# Patient Record
Sex: Male | Born: 1990 | Race: Black or African American | Hispanic: No | Marital: Married | Smoking: Current every day smoker
Health system: Southern US, Community
[De-identification: ages and names within clinical notes are randomized; demographics above are authoritative.]

---
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
## 2013-05-15 ENCOUNTER — Encounter (HOSPITAL_COMMUNITY): Payer: Self-pay | Admitting: Emergency Medicine

## 2013-05-15 ENCOUNTER — Emergency Department (HOSPITAL_COMMUNITY): Payer: No Typology Code available for payment source

## 2013-05-15 ENCOUNTER — Emergency Department (HOSPITAL_COMMUNITY)
Admission: EM | Admit: 2013-05-15 | Discharge: 2013-05-15 | Disposition: A | Discharge status: Home / Self Care | Payer: No Typology Code available for payment source | Attending: Emergency Medicine | Admitting: Emergency Medicine

## 2013-05-15 DIAGNOSIS — S46909A Unspecified injury of unspecified muscle, fascia and tendon at shoulder and upper arm level, unspecified arm, initial encounter: Secondary | ICD-10-CM | POA: Insufficient documentation

## 2013-05-15 DIAGNOSIS — F419 Anxiety disorder, unspecified: Secondary | ICD-10-CM

## 2013-05-15 DIAGNOSIS — Y92410 Unspecified street and highway as the place of occurrence of the external cause: Secondary | ICD-10-CM | POA: Insufficient documentation

## 2013-05-15 DIAGNOSIS — R0682 Tachypnea, not elsewhere classified: Secondary | ICD-10-CM | POA: Insufficient documentation

## 2013-05-15 DIAGNOSIS — R0602 Shortness of breath: Secondary | ICD-10-CM | POA: Insufficient documentation

## 2013-05-15 DIAGNOSIS — R000 Tachycardia, unspecified: Secondary | ICD-10-CM | POA: Insufficient documentation

## 2013-05-15 DIAGNOSIS — F17200 Nicotine dependence, unspecified, uncomplicated: Secondary | ICD-10-CM | POA: Insufficient documentation

## 2013-05-15 DIAGNOSIS — F411 Generalized anxiety disorder: Secondary | ICD-10-CM | POA: Insufficient documentation

## 2013-05-15 DIAGNOSIS — S4990XA Unspecified injury of shoulder and upper arm, unspecified arm, initial encounter: Secondary | ICD-10-CM

## 2013-05-15 DIAGNOSIS — Y9389 Activity, other specified: Secondary | ICD-10-CM | POA: Insufficient documentation

## 2013-05-15 DIAGNOSIS — S4980XA Other specified injuries of shoulder and upper arm, unspecified arm, initial encounter: Secondary | ICD-10-CM | POA: Insufficient documentation

## 2013-05-15 MED ORDER — NAPROXEN 500 MG PO TABS: 500.0000 mg | ORAL_TABLET | Freq: Two times a day (BID) | ORAL | Status: DC

## 2013-05-15 MED ORDER — LORAZEPAM 2 MG/ML IJ SOLN
1.0000 mg | Freq: Once | INTRAMUSCULAR | Status: AC
  Administered 2013-05-15: 1 mg via INTRAVENOUS
  Filled 2013-05-15: qty 1

## 2013-05-15 MED ORDER — SODIUM CHLORIDE 0.9 % IV BOLUS (SEPSIS)
1000.0000 mL | Freq: Once | INTRAVENOUS | Status: AC
  Administered 2013-05-15: 1000 mL via INTRAVENOUS

## 2013-05-15 MED ORDER — CYCLOBENZAPRINE HCL 10 MG PO TABS: 10.0000 mg | ORAL_TABLET | Freq: Two times a day (BID) | ORAL | Status: DC | PRN

## 2013-05-15 NOTE — ED Notes (Signed)
Patient transported to X-ray 
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
## 2013-05-15 NOTE — ED Provider Notes (Signed)
Medical screening examination/treatment/procedure(s) were performed by non-physician practitioner and as supervising physician I was immediately available for consultation/collaboration.     Tre Sanker, MD 05/15/13 1856 

## 2013-05-15 NOTE — Discharge Instructions (Signed)
Acromioclavicular Injuries °The AC (acromioclavicular) joint is the joint in the shoulder where the collarbone (clavicle) meets the shoulder blade (scapula). The part of the shoulder blade connected to the collarbone is called the acromion. Common problems with and treatments for the AC joint are detailed below. °ARTHRITIS °Arthritis occurs when the joint has been injured and the smooth padding between the joints (cartilage) is lost. This is the wear and tear seen in most joints of the body if they have been overused. This causes the joint to produce pain and swelling which is worse with activity.  °AC JOINT SEPARATION °AC joint separation means that the ligaments connecting the acromion of the shoulder blade and collarbone have been damaged, and the two bones no longer line up. AC separations can be anywhere from mild to severe, and are "graded" depending upon which ligaments are torn and how badly they are torn. °· Grade I Injury: the least damage is done, and the AC joint still lines up. °· Grade II Injury: damage to the ligaments which reinforce the AC joint. In a Grade II injury, these ligaments are stretched but not entirely torn. When stressed, the AC joint becomes painful and unstable. °· Grade III Injury: AC and secondary ligaments are completely torn, and the collarbone is no longer attached to the shoulder blade. This results in deformity; a prominence of the end of the clavicle. °AC JOINT FRACTURE °AC joint fracture means that there has been a break in the bones of the AC joint, usually the end of the clavicle. °TREATMENT °TREATMENT OF AC ARTHRITIS °· There is currently no way to replace the cartilage damaged by arthritis. The best way to improve the condition is to decrease the activities which aggravate the problem. Application of ice to the joint helps decrease pain and soreness (inflammation). The use of non-steroidal anti-inflammatory medication is helpful. °· If less conservative measures do not  work, then cortisone shots (injections) may be used. These are anti-inflammatories; they decrease the soreness in the joint and swelling. °· If non-surgical measures fail, surgery may be recommended. The procedure is generally removal of a portion of the end of the clavicle. This is the part of the collarbone closest to your acromion which is stabilized with ligaments to the acromion of the shoulder blade. This surgery may be performed using a tube-like instrument with a light (arthroscope) for looking into a joint. It may also be performed as an open surgery through a small incision by the surgeon. Most patients will have good range of motion within 6 weeks and may return to all activity including sports by 8-12 weeks, barring complications. °TREATMENT OF AN AC SEPARATION °· The initial treatment is to decrease pain. This is best accomplished by immobilizing the arm in a sling and placing an ice pack to the shoulder for 20 to 30 minutes every 2 hours as needed. As the pain starts to subside, it is important to begin moving the fingers, wrist, elbow and eventually the shoulder in order to prevent a stiff or "frozen" shoulder. Instruction on when and how much to move the shoulder will be provided by your caregiver. The length of time needed to regain full motion and function depends on the amount or grade of the injury. Recovery from a Grade I AC separation usually takes 10 to 14 days, whereas a Grade III may take 6 to 8 weeks. °· Grade I and II separations usually do not require surgery. Even Grade III injuries usually allow return to full   activity with few restrictions. Treatment is also based on the activity demands of the injured shoulder. For example, a high level quarterback with an injured throwing arm will receive more aggressive treatment than someone with a desk job who rarely uses his/her arm for strenuous activities. In some cases, a painful lump may persist which could require a later surgery. Surgery  can be very successful, but the benefits must be weighed against the potential risks. TREATMENT OF AN AC JOINT FRACTURE Fracture treatment depends on the type of fracture. Sometimes a splint or sling may be all that is required. Other times surgery may be required for repair. This is more frequently the case when the ligaments supporting the clavicle are completely torn. Your caregiver will help you with these decisions and together you can decide what will be the best treatment. HOME CARE INSTRUCTIONS   Apply ice to the injury for 15-20 minutes each hour while awake for 2 days. Put the ice in a plastic bag and place a towel between the bag of ice and skin.  If a sling has been applied, wear it constantly for as long as directed by your caregiver, even at night. The sling or splint can be removed for bathing or showering or as directed. Be sure to keep the shoulder in the same place as when the sling is on. Do not lift the arm.  If a figure-of-eight splint has been applied it should be tightened gently by another person every day. Tighten it enough to keep the shoulders held back. Allow enough room to place the index finger between the body and strap. Loosen the splint immediately if there is numbness or tingling in the hands.  Take over-the-counter or prescription medicines for pain, discomfort or fever as directed by your caregiver.  If you or your child has received a follow up appointment, it is very important to keep that appointment in order to avoid long term complications, chronic pain or disability. SEEK MEDICAL CARE IF:   The pain is not relieved with medications.  There is increased swelling or discoloration that continues to get worse rather than better.  You or your child has been unable to follow up as instructed.  There is progressive numbness and tingling in the arm, forearm or hand. SEEK IMMEDIATE MEDICAL CARE IF:   The arm is numb, cold or pale.  There is increasing pain  in the hand, forearm or fingers. MAKE SURE YOU:   Understand these instructions.  Will watch your condition.  Will get help right away if you are not doing well or get worse. Document Released: 10/31/2004 Document Revised: 04/15/2011 Document Reviewed: 04/25/2008 [facility] Patient Information 2014 [place].  Motor Vehicle Collision  It is common to have multiple bruises and sore muscles after a motor vehicle collision (MVC). These tend to feel worse for the first 24 hours. You may have the most stiffness and soreness over the first several hours. You may also feel worse when you wake up the first morning after your collision. After this point, you will usually begin to improve with each day. The speed of improvement often depends on the severity of the collision, the number of injuries, and the location and nature of these injuries. HOME CARE INSTRUCTIONS   Put ice on the injured area.  Put ice in a plastic bag.  Place a towel between your skin and the bag.  Leave the ice on for 15-20 minutes, 03-04 times a day.  Drink enough fluids to  keep your urine clear or pale yellow. Do not drink alcohol.  Take a warm shower or bath once or twice a day. This will increase blood flow to sore muscles.  You may return to activities as directed by your caregiver. Be careful when lifting, as this may aggravate neck or back pain.  Only take over-the-counter or prescription medicines for pain, discomfort, or fever as directed by your caregiver. Do not use aspirin. This may increase bruising and bleeding. SEEK IMMEDIATE MEDICAL CARE IF:  You have numbness, tingling, or weakness in the arms or legs.  You develop severe headaches not relieved with medicine.  You have severe neck pain, especially tenderness in the middle of the back of your neck.  You have changes in bowel or bladder control.  There is increasing pain in any area of the body.  You have shortness of breath, lightheadedness,  dizziness, or fainting.  You have chest pain.  You feel sick to your stomach (nauseous), throw up (vomit), or sweat.  You have increasing abdominal discomfort.  There is blood in your urine, stool, or vomit.  You have pain in your shoulder (shoulder strap areas).  You feel your symptoms are getting worse. MAKE SURE YOU:   Understand these instructions.  Will watch your condition.  Will get help right away if you are not doing well or get worse. Document Released: 01/21/2005 Document Revised: 04/15/2011 Document Reviewed: 06/20/2010 [facility] Patient Information 2014 [place], [place]. Alcohol Problems Most adults who drink alcohol drink in moderation (not a lot) are at low risk for developing problems related to their drinking. However, all drinkers, including low-risk drinkers, should know about the health risks connected with drinking alcohol. RECOMMENDATIONS FOR LOW-RISK DRINKING  Drink in moderation. Moderate drinking is defined as follows:   Men - no more than 2 drinks per day.  Nonpregnant women - no more than 1 drink per day.  Over age 25 - no more than 1 drink per day. A standard drink is 12 grams of pure alcohol, which is equal to a 12 ounce bottle of beer or wine cooler, a 5 ounce glass of wine, or 1.5 ounces of distilled spirits (such as whiskey, brandy, vodka, or rum).  ABSTAIN FROM (DO NOT DRINK) ALCOHOL:  When pregnant or considering pregnancy.  When taking a medication that interacts with alcohol.  If you are alcohol dependent.  A medical condition that prohibits drinking alcohol (such as ulcer, liver disease, or heart disease). DISCUSS WITH YOUR CAREGIVER:  If you are at risk for coronary heart disease, discuss the potential benefits and risks of alcohol use: Light to moderate drinking is associated with lower rates of coronary heart disease in certain populations (for example, men over age 25 and postmenopausal women). Infrequent or nondrinkers are advised  not to begin light to moderate drinking to reduce the risk of coronary heart disease so as to avoid creating an alcohol-related problem. Similar protective effects can likely be gained through proper diet and exercise.  Women and the elderly have smaller amounts of body water than men. As a result women and the elderly achieve a higher blood alcohol concentration after drinking the same amount of alcohol.  Exposing a fetus to alcohol can cause a broad range of birth defects referred to as Fetal Alcohol Syndrome (FAS) or Alcohol-Related Birth Defects (ARBD). Although FAS/ARBD is connected with excessive alcohol consumption during pregnancy, studies also have reported neurobehavioral problems in infants born to mothers reporting drinking an average of 1 drink per  day during pregnancy.  Heavier drinking (the consumption of more than 4 drinks per occasion by men and more than 3 drinks per occasion by women) impairs learning (cognitive) and psychomotor functions and increases the risk of alcohol-related problems, including accidents and injuries. CAGE QUESTIONS:   Have you ever felt that you should Cut down on your drinking?  Have people Annoyed you by criticizing your drinking?  Have you ever felt bad or Guilty about your drinking?  Have you ever had a drink first thing in the morning to steady your nerves or get rid of a hangover (Eye opener)? If you answered positively to any of these questions: You may be at risk for alcohol-related problems if alcohol consumption is:   Men: Greater than 14 drinks per week or more than 4 drinks per occasion.  Women: Greater than 7 drinks per week or more than 3 drinks per occasion. Do you or your family have a medical history of alcohol-related problems, such as:  Blackouts.  Sexual dysfunction.  Depression.  Trauma.  Liver dysfunction.  Sleep disorders.  Hypertension.  Chronic abdominal pain.  Has your drinking ever caused you problems, such as  problems with your family, problems with your work (or school) performance, or accidents/injuries?  Do you have a compulsion to drink or a preoccupation with drinking?  Do you have poor control or are you unable to stop drinking once you have started?  Do you have to drink to avoid withdrawal symptoms?  Do you have problems with withdrawal such as tremors, nausea, sweats, or mood disturbances?  Does it take more alcohol than in the past to get you high?  Do you feel a strong urge to drink?  Do you change your plans so that you can have a drink?  Do you ever drink in the morning to relieve the shakes or a hangover? If you have answered a number of the previous questions positively, it may be time for you to talk to your caregivers, family, and friends and see if they think you have a problem. Alcoholism is a chemical dependency that keeps getting worse and will eventually destroy your health and relationships. Many alcoholics end up dead, impoverished, or in prison. This is often the end result of all chemical dependency.  Do not be discouraged if you are not ready to take action immediately.  Decisions to change behavior often involve up and down desires to change and feeling like you cannot decide.  Try to think more seriously about your drinking behavior.  Think of the reasons to quit. WHERE TO GO FOR ADDITIONAL INFORMATION   The National Institute on Alcohol Abuse and Alcoholism (NIAAA) BasicStudents.dk  [employer] on Alcoholism and Drug Dependence (NCADD) www.ncadd.org  American Society of Addiction Medicine (ASAM) RoyalDiary.gl  Document Released: 01/21/2005 Document Revised: 04/15/2011 Document Reviewed: 09/09/2007 [facility] Patient Information 2014 [place], [place].
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
## 2013-08-31 ENCOUNTER — Encounter (HOSPITAL_COMMUNITY): Payer: Self-pay | Admitting: Emergency Medicine

## 2013-08-31 ENCOUNTER — Emergency Department (HOSPITAL_COMMUNITY)
Admission: EM | Admit: 2013-08-31 | Discharge: 2013-08-31 | Disposition: A | Discharge status: Home / Self Care | Payer: No Typology Code available for payment source | Source: Home / Self Care | Attending: Family Medicine | Admitting: Family Medicine

## 2013-08-31 ENCOUNTER — Emergency Department (INDEPENDENT_AMBULATORY_CARE_PROVIDER_SITE_OTHER): Payer: No Typology Code available for payment source

## 2013-08-31 DIAGNOSIS — M25512 Pain in left shoulder: Secondary | ICD-10-CM

## 2013-08-31 DIAGNOSIS — M25519 Pain in unspecified shoulder: Secondary | ICD-10-CM

## 2013-08-31 DIAGNOSIS — H00029 Hordeolum internum unspecified eye, unspecified eyelid: Secondary | ICD-10-CM

## 2013-08-31 DIAGNOSIS — H00026 Hordeolum internum left eye, unspecified eyelid: Secondary | ICD-10-CM

## 2013-08-31 MED ORDER — MELOXICAM 15 MG PO TABS: 15.0000 mg | ORAL_TABLET | Freq: Every day | ORAL | Status: DC

## 2013-08-31 MED ORDER — BACITRACIN-POLYMYXIN B 500-10000 UNIT/GM OP OINT: 1.0000 "application " | TOPICAL_OINTMENT | Freq: Two times a day (BID) | OPHTHALMIC | Status: DC
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
## 2013-08-31 NOTE — Discharge Instructions (Signed)
Blepharitis Blepharitis is redness, soreness, and swelling (inflammation) of one or both eyelids. It may be caused by an allergic reaction or a bacterial infection. Blepharitis may also be associated with reddened, scaly skin (seborrhea) of the scalp and eyebrows. While you sleep, eye discharge may cause your eyelashes to stick together. Your eyelids may itch, burn, swell, and may lose their lashes. These will grow back. Your eyes may become sensitive. Blepharitis may recur and need repeated treatment. If this is the case, you may require further evaluation by an eye specialist (ophthalmologist). HOME CARE INSTRUCTIONS   Keep your hands clean.  Use a clean towel each time you dry your eyelids. Do not use this towel to clean other areas. Do not share a towel or makeup with anyone.  Wash your eyelids with warm water or warm water mixed with a small amount of baby shampoo. Do this twice a day or as often as needed.  Wash your face and eyebrows at least once a day.  Use warm compresses 2 times a day for 10 minutes at a time, or as directed by your caregiver.  Apply antibiotic ointment as directed by your caregiver.  Avoid rubbing your eyes.  Avoid wearing makeup until you get better.  Follow up with your caregiver as directed. SEEK IMMEDIATE MEDICAL CARE IF:   You have pain, redness, or swelling that gets worse or spreads to other parts of your face.  Your vision changes, or you have pain when looking at lights or moving objects.  You have a fever.  Your symptoms continue for longer than 2 to 4 days or become worse. MAKE SURE YOU:   Understand these instructions.  Will watch your condition.  Will get help right away if you are not doing well or get worse. Document Released: 01/19/2000 Document Revised: 04/15/2011 Document Reviewed: 02/28/2010 [facility] Patient Information 2015 [place], [place]. This information is not intended to replace advice given to you by your health care  provider. Make sure you discuss any questions you have with your health care provider.  Sty A sty (hordeolum) is an infection of a gland in the eyelid located at the base of the eyelash. A sty may develop a white or yellow head of pus. It can be puffy (swollen). Usually, the sty will burst and pus will come out on its own. They do not leave lumps in the eyelid once they drain. A sty is often confused with another form of cyst of the eyelid called a chalazion. Chalazions occur within the eyelid and not on the edge where the bases of the eyelashes are. They often are red, sore and then form firm lumps in the eyelid. CAUSES   Germs (bacteria).  Lasting (chronic) eyelid inflammation. SYMPTOMS   Tenderness, redness and swelling along the edge of the eyelid at the base of the eyelashes.  Sometimes, there is a white or yellow head of pus. It may or may not drain. DIAGNOSIS  An ophthalmologist will be able to distinguish between a sty and a chalazion and treat the condition appropriately.  TREATMENT   Styes are typically treated with warm packs (compresses) until drainage occurs.  In rare cases, medicines that kill germs (antibiotics) may be prescribed. These antibiotics may be in the form of drops, cream or pills.  If a hard lump has formed, it is generally necessary to do a small incision and remove the hardened contents of the cyst in a minor surgical procedure done in the office.  In suspicious  cases, your caregiver may send the contents of the cyst to the lab to be certain that it is not a rare, but dangerous form of cancer of the glands of the eyelid. °HOME CARE INSTRUCTIONS  °· Wash your hands often and dry them with a clean towel. Avoid touching your eyelid. This may spread the infection to other parts of the eye. °· Apply heat to your eyelid for 10 to 20 minutes, several times a day, to ease pain and help to heal it faster. °· Do not squeeze the sty. Allow it to drain on its own. Wash your  eyelid carefully 3 to 4 times per day to remove any pus. °SEEK IMMEDIATE MEDICAL CARE IF:  °· Your eye becomes painful or puffy (swollen). °· Your vision changes. °· Your sty does not drain by itself within 3 days. °· Your sty comes back within a short period of time, even with treatment. °· You have redness (inflammation) around the eye. °· You have a fever. °Document Released: 10/31/2004 Document Revised: 04/15/2011 Document Reviewed: 05/07/2013 °ExitCare® Patient Information ©2015 [employer] LLC. This information is not intended to replace advice given to you by your health care provider. Make sure you discuss any questions you have with your health care provider. ° °

## 2013-08-31 NOTE — ED Provider Notes (Signed)
Medical screening examination/treatment/procedure(s) were performed by resident physician or non-physician practitioner and as supervising physician I was immediately available for consultation/collaboration.   Amritpal Shropshire DOUGLAS MD.   Barnabas Henriques D Sevag Shearn, MD 08/31/13 1701 

## 2014-05-16 ENCOUNTER — Encounter (HOSPITAL_COMMUNITY): Payer: Self-pay

## 2014-05-16 ENCOUNTER — Emergency Department (INDEPENDENT_AMBULATORY_CARE_PROVIDER_SITE_OTHER)
Admission: EM | Admit: 2014-05-16 | Discharge: 2014-05-16 | Disposition: A | Discharge status: Home / Self Care | Payer: No Typology Code available for payment source | Source: Home / Self Care | Attending: Emergency Medicine | Admitting: Emergency Medicine

## 2014-05-16 DIAGNOSIS — K088 Other specified disorders of teeth and supporting structures: Secondary | ICD-10-CM | POA: Diagnosis not present

## 2014-05-16 DIAGNOSIS — K0889 Other specified disorders of teeth and supporting structures: Secondary | ICD-10-CM

## 2014-05-16 MED ORDER — HYDROCODONE-ACETAMINOPHEN 7.5-325 MG PO TABS: 1.0000 | ORAL_TABLET | ORAL | Status: DC | PRN

## 2014-05-16 MED ORDER — AMOXICILLIN 500 MG PO CAPS: 1000.0000 mg | ORAL_CAPSULE | Freq: Two times a day (BID) | ORAL | Status: AC

## 2014-05-16 NOTE — Discharge Instructions (Signed)
Dental Pain °A tooth ache may be caused by cavities (tooth decay). Cavities expose the nerve of the tooth to air and hot or cold temperatures. It may come from an infection or abscess (also called a boil or furuncle) around your tooth. It is also often caused by dental caries (tooth decay). This causes the pain you are having. °DIAGNOSIS  °Your caregiver can diagnose this problem by exam. °TREATMENT  °· If caused by an infection, it may be treated with medications which kill germs (antibiotics) and pain medications as prescribed by your caregiver. Take medications as directed. °· Only take over-the-counter or prescription medicines for pain, discomfort, or fever as directed by your caregiver. °· Whether the tooth ache today is caused by infection or dental disease, you should see your dentist as soon as possible for further care. °SEEK MEDICAL CARE IF: °The exam and treatment you received today has been provided on an emergency basis only. This is not a substitute for complete medical or dental care. If your problem worsens or new problems (symptoms) appear, and you are unable to meet with your dentist, call or return to this location. °SEEK IMMEDIATE MEDICAL CARE IF:  °· You have a fever. °· You develop redness and swelling of your face, jaw, or neck. °· You are unable to open your mouth. °· You have severe pain uncontrolled by pain medicine. °MAKE SURE YOU:  °· Understand these instructions. °· Will watch your condition. °· Will get help right away if you are not doing well or get worse. °Document Released: 01/21/2005 Document Revised: 04/15/2011 Document Reviewed: 09/09/2007 °ExitCare® Patient Information ©2015 [employer], LLC. This information is not intended to replace advice given to you by your health care provider. Make sure you discuss any questions you have with your health care provider. ° °Dental Care and Dentist Visits °Dental care supports good overall health. Regular dental visits can also help you  avoid dental pain, bleeding, infection, and other more serious health problems in the future. It is important to keep the mouth healthy because diseases in the teeth, gums, and other oral tissues can spread to other areas of the body. Some problems, such as diabetes, heart disease, and pre-term labor have been associated with poor oral health.  °See your dentist every 6 months. If you experience emergency problems such as a toothache or broken tooth, go to the dentist right away. If you see your dentist regularly, you may catch problems early. It is easier to be treated for problems in the early stages.  °WHAT TO EXPECT AT A DENTIST VISIT  °Your dentist will look for many common oral health problems and recommend proper treatment. At your regular dental visit, you can expect: °· Gentle cleaning of the teeth and gums. This includes scraping and polishing. This helps to remove the sticky substance around the teeth and gums (plaque). Plaque forms in the mouth shortly after eating. Over time, plaque hardens on the teeth as tartar. If tartar is not removed regularly, it can cause problems. Cleaning also helps remove stains. °· Periodic X-rays. These pictures of the teeth and supporting bone will help your dentist assess the health of your teeth. °· Periodic fluoride treatments. Fluoride is a natural mineral shown to help strengthen teeth. Fluoride treatment involves applying a fluoride gel or varnish to the teeth. It is most commonly done in children. °· Examination of the mouth, tongue, jaws, teeth, and gums to look for any oral health problems, such as: °¨ Cavities (dental caries). This is   decay on the tooth caused by plaque, sugar, and acid in the mouth. It is best to catch a cavity when it is small. °¨ Inflammation of the gums caused by plaque buildup (gingivitis). °¨ Problems with the mouth or malformed or misaligned teeth. °¨ Oral cancer or other diseases of the soft tissues or jaws.  °KEEP YOUR TEETH AND GUMS  HEALTHY °For healthy teeth and gums, follow these general guidelines as well as your dentist's specific advice: °· Have your teeth professionally cleaned at the dentist every 6 months. °· Brush twice daily with a fluoride toothpaste. °· Floss your teeth daily.  °· Ask your dentist if you need fluoride supplements, treatments, or fluoride toothpaste. °· Eat a healthy diet. Reduce foods and drinks with added sugar. °· Avoid smoking. °TREATMENT FOR ORAL HEALTH PROBLEMS °If you have oral health problems, treatment varies depending on the conditions present in your teeth and gums. °· Your caregiver will most likely recommend good oral hygiene at each visit. °· For cavities, gingivitis, or other oral health disease, your caregiver will perform a procedure to treat the problem. This is typically done at a separate appointment. Sometimes your caregiver will refer you to another dental specialist for specific tooth problems or for surgery. °SEEK IMMEDIATE DENTAL CARE IF: °· You have pain, bleeding, or soreness in the gum, tooth, jaw, or mouth area. °· A permanent tooth becomes loose or separated from the gum socket. °· You experience a blow or injury to the mouth or jaw area. °Document Released: 10/03/2010 Document Revised: 04/15/2011 Document Reviewed: 10/03/2010 °ExitCare® Patient Information ©2015 [employer], LLC. This information is not intended to replace advice given to you by your health care provider. Make sure you discuss any questions you have with your health care provider. ° °

## 2014-05-16 NOTE — ED Provider Notes (Signed)
CSN: [id number]     Arrival date & time 05/16/14  1209 History   First MD Initiated Contact with Patient 05/16/14 1348     Chief Complaint  Patient presents with  . Dental Pain   (Consider location/radiation/quality/duration/timing/severity/associated sxs/prior Treatment) HPI Comments: 24 year old male with right lower toothache for 2-3 months. This beginning worse for the past couple weeks and now with pain is unbearable.   History reviewed. No pertinent past medical history. History reviewed. No pertinent past surgical history. History reviewed. No pertinent family history. History  Substance Use Topics  . Smoking status: Current Every Day Smoker    Types: Cigarettes  . Smokeless tobacco: Not on file  . Alcohol Use: Yes    Review of Systems  Constitutional: Negative.  Negative for fever.  HENT: Positive for dental problem. Negative for congestion, facial swelling and postnasal drip.     Allergies  Review of patient's allergies indicates no known allergies.  Home Medications   Prior to Admission medications   Medication Sig Start Date End Date Taking? Authorizing Provider  amoxicillin (AMOXIL) 500 MG capsule Take 2 capsules (1,000 mg total) by mouth 2 (two) times daily. 05/16/14   Hayden Rasmussenavid Nezar Buckles, NP  HYDROcodone-acetaminophen (NORCO) 7.5-325 MG per tablet Take 1 tablet by mouth every 4 (four) hours as needed. 05/16/14   Hayden Rasmussenavid Odaliz Mcqueary, NP   BP 131/70 mmHg  Pulse 66  Temp(Src) 98.5 F (36.9 C) (Oral)  Resp 23  SpO2 100% Physical Exam  Constitutional: He is oriented to person, place, and time. He appears well-developed and well-nourished. No distress.  HENT:  Mouth/Throat: Oropharynx is clear and moist. No oropharyngeal exudate.  Right lower third molar is deep within the gingiva with partial exposure above the gumline. Positive dental tenderness. Some hypertrophied gingiva surrounding the last 2 and 3 molars. No abscess formation is seen.   Neck: Normal range of motion. Neck  supple.  Lymphadenopathy:    He has no cervical adenopathy.  Neurological: He is alert and oriented to person, place, and time.  Skin: Skin is warm and dry.  Nursing note and vitals reviewed.   ED Course  Procedures (including critical care time) Labs Review Labs Reviewed - No data to display  Imaging Review No results found.   MDM   1. Pain, dental    Amoxil norco 7.5  #15 See dentist next week as planned    Hayden Rasmussenavid Trinisha Paget, NP 05/16/14 1406

## 2014-05-16 NOTE — ED Notes (Signed)
C/o pain in teeth for a couple of months

## 2014-11-11 ENCOUNTER — Emergency Department (HOSPITAL_COMMUNITY)
Admission: EM | Admit: 2014-11-11 | Discharge: 2014-11-11 | Disposition: A | Discharge status: Home / Self Care | Payer: No Typology Code available for payment source | Attending: Emergency Medicine | Admitting: Emergency Medicine

## 2014-11-11 ENCOUNTER — Encounter (HOSPITAL_COMMUNITY): Payer: Self-pay | Admitting: Nurse Practitioner

## 2014-11-11 ENCOUNTER — Emergency Department (HOSPITAL_COMMUNITY): Payer: No Typology Code available for payment source

## 2014-11-11 DIAGNOSIS — Z792 Long term (current) use of antibiotics: Secondary | ICD-10-CM | POA: Insufficient documentation

## 2014-11-11 DIAGNOSIS — J159 Unspecified bacterial pneumonia: Secondary | ICD-10-CM | POA: Insufficient documentation

## 2014-11-11 DIAGNOSIS — R079 Chest pain, unspecified: Secondary | ICD-10-CM | POA: Diagnosis present

## 2014-11-11 DIAGNOSIS — R0789 Other chest pain: Secondary | ICD-10-CM | POA: Diagnosis not present

## 2014-11-11 DIAGNOSIS — Z720 Tobacco use: Secondary | ICD-10-CM | POA: Insufficient documentation

## 2014-11-11 DIAGNOSIS — J189 Pneumonia, unspecified organism: Secondary | ICD-10-CM

## 2014-11-11 LAB — BASIC METABOLIC PANEL
Anion gap: 12 (ref 5–15)
CALCIUM: 9.9 mg/dL (ref 8.9–10.3)
CO2: 25 mmol/L (ref 22–32)
CREATININE: 1.12 mg/dL (ref 0.61–1.24)
Chloride: 103 mmol/L (ref 101–111)
GFR calc Af Amer: >60 mL/min (ref >60)
GLUCOSE: 89 mg/dL (ref 65–99)
POTASSIUM: 3.7 mmol/L (ref 3.5–5.1)
SODIUM: 140 mmol/L (ref 135–145)

## 2014-11-11 LAB — CBC
HEMATOCRIT: 33.1 % — AB (ref 39.0–52.0)
Hemoglobin: 10.9 g/dL — ABNORMAL LOW (ref 13.0–17.0)
MCH: 27 pg (ref 26.0–34.0)
MCHC: 32.9 g/dL (ref 30.0–36.0)
MCV: 82.1 fL (ref 78.0–100.0)
PLATELETS: 228 10*3/uL (ref 150–400)
RBC: 4.03 MIL/uL — ABNORMAL LOW (ref 4.22–5.81)
RDW: 14.3 % (ref 11.5–15.5)
WBC: 13.6 10*3/uL — AB (ref 4.0–10.5)

## 2014-11-11 LAB — I-STAT TROPONIN, ED: TROPONIN I, POC: 0 ng/mL (ref 0.00–0.08)

## 2014-11-11 MED ORDER — ACETAMINOPHEN 325 MG PO TABS
650.0000 mg | ORAL_TABLET | Freq: Once | ORAL | Status: AC
  Administered 2014-11-11: 650 mg via ORAL
  Filled 2014-11-11: qty 2

## 2014-11-11 MED ORDER — AZITHROMYCIN 250 MG PO TABS: 250.0000 mg | ORAL_TABLET | Freq: Every day | ORAL | Status: AC

## 2014-11-11 MED ORDER — BENZONATATE 100 MG PO CAPS: 100.0000 mg | ORAL_CAPSULE | Freq: Three times a day (TID) | ORAL | Status: AC

## 2014-11-11 NOTE — ED Notes (Signed)
Pt c/o 1 week history of productive cough, chills, sweats, feeling like he can not catch his breath and pain in chest. He tried to rub vicks on his chest and took dayquil with no relief. He is a&ox4,resp e/u

## 2014-11-11 NOTE — ED Provider Notes (Signed)
CSN: [id number]     Arrival date & time 11/11/14  1201 History   First MD Initiated Contact with Patient 11/11/14 1245     Chief Complaint  Patient presents with  . Chest Pain     (Consider location/radiation/quality/duration/timing/severity/associated sxs/prior Treatment) HPI Comments: Patient presents today with a chief complaint of productive cough, chills, chest pain, subjective fever, mild SOB, and congestion.  Symptoms have been present for the past week and gradually worsening.  He has taken Dayquil and used vicks rub for his symptoms without improvement.  He reports that the chest pain is located right anterior chest and also comes on when he coughs very hard.  No chest pain when not coughing.  He states that he has not checked his temperature at home, but has felt warm.  Temp in the ED is 100.8 F orally.  He denies nausea, vomiting, diarrhea, headache, urinary symptoms, abdominal pain, hemoptysis, or LE edema.  He reports that he currently smokes cigarettes.  He denies any recent hospitalizations or sick contacts.    Patient is a 24 y.o. male presenting with chest pain. The history is provided by the patient.  Chest Pain   History reviewed. No pertinent past medical history. History reviewed. No pertinent past surgical history. History reviewed. No pertinent family history. Social History  Substance Use Topics  . Smoking status: Current Every Day Smoker    Types: Cigarettes  . Smokeless tobacco: None  . Alcohol Use: Yes    Review of Systems  Cardiovascular: Positive for chest pain.  All other systems reviewed and are negative.     Allergies  Review of patient's allergies indicates no known allergies.  Home Medications   Prior to Admission medications   Medication Sig Start Date End Date Taking? Authorizing Provider  amoxicillin (AMOXIL) 500 MG capsule Take 2 capsules (1,000 mg total) by mouth 2 (two) times daily. 05/16/14   Hayden Rasmussen, NP  HYDROcodone-acetaminophen  (NORCO) 7.5-325 MG per tablet Take 1 tablet by mouth every 4 (four) hours as needed. 05/16/14   Hayden Rasmussen, NP   BP 133/79 mmHg  Pulse 77  Temp(Src) 99 F (37.2 C) (Oral)  Resp 16  Ht  (1.753 m)  Wt 160 lb (72.576 kg)  BMI 23.62 kg/m2  SpO2 100% Physical Exam  Constitutional: He appears well-developed and well-nourished.  HENT:  Head: Normocephalic and atraumatic.  Mouth/Throat: Oropharynx is clear and moist.  Neck: Normal range of motion. Neck supple.  Cardiovascular: Normal rate, regular rhythm and normal heart sounds.   Pulmonary/Chest: Effort normal and breath sounds normal. He exhibits tenderness.  Abdominal: Soft. Bowel sounds are normal. He exhibits no distension and no mass. There is no tenderness. There is no rebound and no guarding.  Musculoskeletal: Normal range of motion.  Neurological: He is alert.  Skin: Skin is warm and dry.  Psychiatric: He has a normal mood and affect.    ED Course  Procedures (including critical care time) Labs Review Labs Reviewed  CBC - Abnormal; Notable for the following:    WBC 13.6 (*)    RBC 4.03 (*)    Hemoglobin 10.9 (*)    HCT 33.1 (*)    All other components within normal limits  BASIC METABOLIC PANEL  I-STAT TROPOININ, ED    Imaging Review Dg Chest 2 View  11/11/2014   CLINICAL DATA:  Cough, chest tightness, shortness of breath, hard time catching breath, smoker, symptoms for 1 week  EXAM: CHEST  2 VIEW  COMPARISON:  08/31/2013  FINDINGS: Normal heart size, mediastinal contours, and pulmonary vascularity.  Lungs hyperinflated with increased markings in the medial RIGHT middle lobe question subtle infiltrate or atelectasis.  Remaining lungs clear.  No pleural effusion or pneumothorax.  Bones unremarkable.  IMPRESSION: Hyperinflated lungs with mild atelectasis versus infiltrate in medial RIGHT middle lobe.   Electronically Signed   By: Ulyses Southward M.D.   On: 11/11/2014 12:24   I have personally reviewed and evaluated these  images and lab results as part of my medical decision-making.   EKG Interpretation None      MDM   Final diagnoses:  None   Patient presents today with fever, chills, cough, and CP with coughing.  CXR showing possible Pneumonia.  No recent hospitalizations in the past 90 days.  Patient otherwise healthy and not immunocompromised.  Therefore, will treat for CAP with Azithromycin..  Patient is not hypoxic and tolerating PO.  No signs of respiratory distress.  Blood pressure stable.  Non toxic appearing.  Therefore, feel that he can be treated outpatient.  Patient stable for discharge.  Return precautions given.    Santiago Glad, PA-C 11/11/14 1718  Lavera Guise, MD 11/11/14 2055

## 2014-12-13 ENCOUNTER — Emergency Department (HOSPITAL_COMMUNITY): Payer: No Typology Code available for payment source

## 2014-12-13 ENCOUNTER — Encounter (HOSPITAL_COMMUNITY): Payer: Self-pay

## 2014-12-13 ENCOUNTER — Emergency Department (HOSPITAL_COMMUNITY)
Admission: EM | Admit: 2014-12-13 | Discharge: 2014-12-13 | Disposition: A | Discharge status: Home / Self Care | Payer: No Typology Code available for payment source | Attending: Emergency Medicine | Admitting: Emergency Medicine

## 2014-12-13 DIAGNOSIS — Y9389 Activity, other specified: Secondary | ICD-10-CM | POA: Insufficient documentation

## 2014-12-13 DIAGNOSIS — Y92410 Unspecified street and highway as the place of occurrence of the external cause: Secondary | ICD-10-CM | POA: Diagnosis not present

## 2014-12-13 DIAGNOSIS — Y998 Other external cause status: Secondary | ICD-10-CM | POA: Diagnosis not present

## 2014-12-13 DIAGNOSIS — S0990XA Unspecified injury of head, initial encounter: Secondary | ICD-10-CM | POA: Insufficient documentation

## 2014-12-13 DIAGNOSIS — S199XXA Unspecified injury of neck, initial encounter: Secondary | ICD-10-CM | POA: Diagnosis not present

## 2014-12-13 DIAGNOSIS — Z720 Tobacco use: Secondary | ICD-10-CM | POA: Insufficient documentation

## 2014-12-13 DIAGNOSIS — E041 Nontoxic single thyroid nodule: Secondary | ICD-10-CM | POA: Insufficient documentation

## 2014-12-13 DIAGNOSIS — M542 Cervicalgia: Secondary | ICD-10-CM

## 2014-12-13 MED ORDER — IBUPROFEN 800 MG PO TABS
800.0000 mg | ORAL_TABLET | Freq: Once | ORAL | Status: AC
  Administered 2014-12-13: 800 mg via ORAL
  Filled 2014-12-13: qty 1

## 2014-12-13 MED ORDER — ACETAMINOPHEN 500 MG PO TABS
1000.0000 mg | ORAL_TABLET | Freq: Once | ORAL | Status: AC
  Administered 2014-12-13: 1000 mg via ORAL
  Filled 2014-12-13: qty 2
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
## 2014-12-13 NOTE — ED Notes (Signed)
MD at bedside. 
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
## 2015-11-22 ENCOUNTER — Encounter (HOSPITAL_COMMUNITY): Payer: Self-pay | Admitting: Emergency Medicine

## 2015-11-22 ENCOUNTER — Ambulatory Visit (HOSPITAL_COMMUNITY)
Admission: EM | Admit: 2015-11-22 | Discharge: 2015-11-22 | Disposition: A | Discharge status: Home / Self Care | Payer: No Typology Code available for payment source | Attending: Family Medicine | Admitting: Family Medicine

## 2015-11-22 ENCOUNTER — Ambulatory Visit (INDEPENDENT_AMBULATORY_CARE_PROVIDER_SITE_OTHER): Payer: Self-pay

## 2015-11-22 DIAGNOSIS — S61511A Laceration without foreign body of right wrist, initial encounter: Secondary | ICD-10-CM | POA: Diagnosis not present

## 2015-11-22 DIAGNOSIS — Z23 Encounter for immunization: Secondary | ICD-10-CM

## 2015-11-22 MED ORDER — TETANUS-DIPHTH-ACELL PERTUSSIS 5-2.5-18.5 LF-MCG/0.5 IM SUSP
0.5000 mL | Freq: Once | INTRAMUSCULAR | Status: AC
  Administered 2015-11-22: 0.5 mL via INTRAMUSCULAR

## 2015-11-22 MED ORDER — TETANUS-DIPHTH-ACELL PERTUSSIS 5-2.5-18.5 LF-MCG/0.5 IM SUSP
INTRAMUSCULAR | Status: AC
  Filled 2015-11-22: qty 0.5
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
## 2016-02-03 ENCOUNTER — Emergency Department (HOSPITAL_COMMUNITY): Payer: Self-pay

## 2016-02-03 ENCOUNTER — Encounter (HOSPITAL_COMMUNITY): Payer: Self-pay | Admitting: Emergency Medicine

## 2016-02-03 DIAGNOSIS — M62830 Muscle spasm of back: Secondary | ICD-10-CM | POA: Insufficient documentation

## 2016-02-03 DIAGNOSIS — R0789 Other chest pain: Secondary | ICD-10-CM | POA: Insufficient documentation

## 2016-02-03 DIAGNOSIS — Z79899 Other long term (current) drug therapy: Secondary | ICD-10-CM | POA: Insufficient documentation

## 2016-02-03 DIAGNOSIS — F17210 Nicotine dependence, cigarettes, uncomplicated: Secondary | ICD-10-CM | POA: Insufficient documentation

## 2016-02-03 DIAGNOSIS — Y929 Unspecified place or not applicable: Secondary | ICD-10-CM | POA: Insufficient documentation

## 2016-02-03 DIAGNOSIS — Y939 Activity, unspecified: Secondary | ICD-10-CM | POA: Insufficient documentation

## 2016-02-03 DIAGNOSIS — R1012 Left upper quadrant pain: Secondary | ICD-10-CM | POA: Insufficient documentation

## 2016-02-03 DIAGNOSIS — Y999 Unspecified external cause status: Secondary | ICD-10-CM | POA: Insufficient documentation
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
## 2016-02-04 ENCOUNTER — Emergency Department (HOSPITAL_COMMUNITY): Payer: Self-pay

## 2016-02-04 ENCOUNTER — Emergency Department (HOSPITAL_COMMUNITY)
Admission: EM | Admit: 2016-02-04 | Discharge: 2016-02-04 | Disposition: A | Discharge status: Home / Self Care | Payer: Self-pay | Attending: Emergency Medicine | Admitting: Emergency Medicine

## 2016-02-04 DIAGNOSIS — R0789 Other chest pain: Secondary | ICD-10-CM

## 2016-02-04 LAB — CBC WITH DIFFERENTIAL/PLATELET
BASOS PCT: 0 %
Basophils Absolute: 0 10*3/uL (ref 0.0–0.1)
EOS ABS: 0.1 10*3/uL (ref 0.0–0.7)
EOS PCT: 1 %
HCT: 32.8 % — ABNORMAL LOW (ref 39.0–52.0)
HEMOGLOBIN: 11 g/dL — AB (ref 13.0–17.0)
LYMPHS ABS: 4.1 10*3/uL — AB (ref 0.7–4.0)
Lymphocytes Relative: 36 %
MCH: 26.1 pg (ref 26.0–34.0)
MCHC: 33.5 g/dL (ref 30.0–36.0)
MCV: 77.9 fL — ABNORMAL LOW (ref 78.0–100.0)
Monocytes Absolute: 1.1 10*3/uL — ABNORMAL HIGH (ref 0.1–1.0)
Monocytes Relative: 9 %
NEUTROS PCT: 54 %
Neutro Abs: 6.2 10*3/uL (ref 1.7–7.7)
PLATELETS: 163 10*3/uL (ref 150–400)
RBC: 4.21 MIL/uL — AB (ref 4.22–5.81)
RDW: 18.4 % — ABNORMAL HIGH (ref 11.5–15.5)
WBC: 11.5 10*3/uL — AB (ref 4.0–10.5)

## 2016-02-04 LAB — COMPREHENSIVE METABOLIC PANEL
ALBUMIN: 3.6 g/dL (ref 3.5–5.0)
ALT: 21 U/L (ref 17–63)
ANION GAP: 11 (ref 5–15)
AST: 43 U/L — ABNORMAL HIGH (ref 15–41)
Alkaline Phosphatase: 78 U/L (ref 38–126)
BUN: 14 mg/dL (ref 6–20)
CHLORIDE: 105 mmol/L (ref 101–111)
CO2: 22 mmol/L (ref 22–32)
CREATININE: 1.29 mg/dL — AB (ref 0.61–1.24)
Calcium: 9.3 mg/dL (ref 8.9–10.3)
GFR calc non Af Amer: >60 mL/min (ref >60)
GLUCOSE: 102 mg/dL — AB (ref 65–99)
Potassium: 3.5 mmol/L (ref 3.5–5.1)
SODIUM: 138 mmol/L (ref 135–145)
Total Bilirubin: 0.6 mg/dL (ref 0.3–1.2)
Total Protein: 7.1 g/dL (ref 6.5–8.1)

## 2016-02-04 LAB — LIPASE, BLOOD: Lipase: 35 U/L (ref 11–51)

## 2016-02-04 MED ORDER — SODIUM CHLORIDE 0.9 % IV BOLUS (SEPSIS)
1000.0000 mL | Freq: Once | INTRAVENOUS | Status: AC
  Administered 2016-02-04: 1000 mL via INTRAVENOUS

## 2016-02-04 MED ORDER — ONDANSETRON HCL 4 MG/2ML IJ SOLN
4.0000 mg | Freq: Once | INTRAMUSCULAR | Status: AC
  Administered 2016-02-04: 4 mg via INTRAVENOUS
  Filled 2016-02-04: qty 2

## 2016-02-04 MED ORDER — IOPAMIDOL (ISOVUE-300) INJECTION 61%
INTRAVENOUS | Status: AC
  Administered 2016-02-04: 100 mL
  Filled 2016-02-04: qty 100

## 2016-02-04 MED ORDER — MORPHINE SULFATE (PF) 4 MG/ML IV SOLN
4.0000 mg | Freq: Once | INTRAVENOUS | Status: AC
Start: 2016-02-04 — End: 2016-02-04
  Administered 2016-02-04: 4 mg via INTRAVENOUS
  Filled 2016-02-04: qty 1
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
## 2016-02-04 NOTE — ED Notes (Signed)
Patient transported to CT 

## 2016-02-04 NOTE — Discharge Instructions (Signed)
Take 4 over the counter ibuprofen tablets 3 times a day or 2 over-the-counter naproxen tablets twice a day for pain. Also take tylenol 1000mg(2 extra strength) four times a day.    

## 2017-06-10 ENCOUNTER — Emergency Department (HOSPITAL_COMMUNITY): Payer: Self-pay

## 2017-06-10 ENCOUNTER — Emergency Department (HOSPITAL_COMMUNITY)
Admission: EM | Admit: 2017-06-10 | Discharge: 2017-06-10 | Disposition: A | Discharge status: Home / Self Care | Payer: Self-pay | Attending: Emergency Medicine | Admitting: Emergency Medicine

## 2017-06-10 ENCOUNTER — Encounter (HOSPITAL_COMMUNITY): Payer: Self-pay | Admitting: Emergency Medicine

## 2017-06-10 DIAGNOSIS — Y999 Unspecified external cause status: Secondary | ICD-10-CM | POA: Insufficient documentation

## 2017-06-10 DIAGNOSIS — Z79899 Other long term (current) drug therapy: Secondary | ICD-10-CM | POA: Insufficient documentation

## 2017-06-10 DIAGNOSIS — Y939 Activity, unspecified: Secondary | ICD-10-CM | POA: Insufficient documentation

## 2017-06-10 DIAGNOSIS — F10120 Alcohol abuse with intoxication, uncomplicated: Secondary | ICD-10-CM | POA: Insufficient documentation

## 2017-06-10 DIAGNOSIS — Y906 Blood alcohol level of 120-199 mg/100 ml: Secondary | ICD-10-CM | POA: Insufficient documentation

## 2017-06-10 DIAGNOSIS — Y929 Unspecified place or not applicable: Secondary | ICD-10-CM | POA: Insufficient documentation

## 2017-06-10 DIAGNOSIS — F17210 Nicotine dependence, cigarettes, uncomplicated: Secondary | ICD-10-CM | POA: Insufficient documentation

## 2017-06-10 DIAGNOSIS — S52572A Other intraarticular fracture of lower end of left radius, initial encounter for closed fracture: Secondary | ICD-10-CM | POA: Insufficient documentation

## 2017-06-10 DIAGNOSIS — W010XXA Fall on same level from slipping, tripping and stumbling without subsequent striking against object, initial encounter: Secondary | ICD-10-CM | POA: Insufficient documentation

## 2017-06-10 LAB — CBC
HCT: 38.9 % — ABNORMAL LOW (ref 39.0–52.0)
HEMOGLOBIN: 13 g/dL (ref 13.0–17.0)
MCH: 30.5 pg (ref 26.0–34.0)
MCHC: 33.4 g/dL (ref 30.0–36.0)
MCV: 91.3 fL (ref 78.0–100.0)
PLATELETS: 201 10*3/uL (ref 150–400)
RBC: 4.26 MIL/uL (ref 4.22–5.81)
RDW: 14.7 % (ref 11.5–15.5)
WBC: 7.6 10*3/uL (ref 4.0–10.5)

## 2017-06-10 LAB — COMPREHENSIVE METABOLIC PANEL
ALT: 21 U/L (ref 17–63)
AST: 38 U/L (ref 15–41)
Albumin: 4 g/dL (ref 3.5–5.0)
Alkaline Phosphatase: 94 U/L (ref 38–126)
Anion gap: 17 — ABNORMAL HIGH (ref 5–15)
BILIRUBIN TOTAL: 0.7 mg/dL (ref 0.3–1.2)
BUN: 8 mg/dL (ref 6–20)
CO2: 17 mmol/L — ABNORMAL LOW (ref 22–32)
Calcium: 9 mg/dL (ref 8.9–10.3)
Chloride: 110 mmol/L (ref 101–111)
Creatinine, Ser: 1.41 mg/dL — ABNORMAL HIGH (ref 0.61–1.24)
GFR calc Af Amer: >60 mL/min (ref >60)
Glucose, Bld: 84 mg/dL (ref 65–99)
POTASSIUM: 3.5 mmol/L (ref 3.5–5.1)
Sodium: 144 mmol/L (ref 135–145)
TOTAL PROTEIN: 7.1 g/dL (ref 6.5–8.1)

## 2017-06-10 LAB — ETHANOL: ALCOHOL ETHYL (B): 151 mg/dL — AB (ref <10)

## 2017-06-10 MED ORDER — HYDROCODONE-ACETAMINOPHEN 5-325 MG PO TABS
1.0000 | ORAL_TABLET | Freq: Once | ORAL | Status: AC
  Administered 2017-06-10: 1 via ORAL
  Filled 2017-06-10: qty 1

## 2017-06-10 MED ORDER — HYDROCODONE-ACETAMINOPHEN 5-325 MG PO TABS: 1.0000 | ORAL_TABLET | Freq: Four times a day (QID) | ORAL | 0 refills | Status: AC | PRN
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
## 2017-06-10 NOTE — ED Notes (Signed)
Patient getting very verbally abusive yelling stating everyone is being rude to him. No one has even been talking to him.
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
## 2017-06-10 NOTE — ED Triage Notes (Signed)
Pt presents with L wrist injury after falling down 2 stairs; pt also reports drinking more than he usually does and smoking marajuana; pt unsteady and in wheelchair and cannot sit up straight in wheelchair, almost falling out

## 2017-06-10 NOTE — ED Provider Notes (Signed)
[facility] EMERGENCY DEPARTMENT Provider Note   CSN: [id number] Arrival date & time: 06/10/17  0248     History   Chief Complaint Chief Complaint  Patient presents with  . Wrist Pain  . Alcohol Intoxication  . Drug Problem    HPI Ryan Kaufman is a 27 y.o. male.  HPI  This is a 27 year old male who presents in police custody with left wrist pain.  Patient reports he tripped and fell onto his left arm.  Denies hitting his head or loss of consciousness.  He does have alcohol on board.  When asked more specific questions, patient states "I got time for that."  He is generally uncooperative.  He will not rate his pain for me.  Denies any other pain at this time.  Level 5 caveat for patient cooperation  History reviewed. No pertinent past medical history.  There are no active problems to display for this patient.   History reviewed. No pertinent surgical history.      Home Medications    Prior to Admission medications   Medication Sig Start Date End Date Taking? Authorizing Provider  amoxicillin (AMOXIL) 500 MG capsule Take 2 capsules (1,000 mg total) by mouth 2 (two) times daily. Patient not taking: Reported on 02/04/2016 05/16/14   Hayden Rasmussen, NP  azithromycin (ZITHROMAX) 250 MG tablet Take 1 tablet (250 mg total) by mouth daily. Take first 2 tablets together, then 1 every day until finished. Patient not taking: Reported on 02/04/2016 11/11/14   Santiago Glad, PA-C  benzonatate (TESSALON) 100 MG capsule Take 1 capsule (100 mg total) by mouth every 8 (eight) hours. Patient not taking: Reported on 02/04/2016 11/11/14   Santiago Glad, PA-C  HYDROcodone-acetaminophen (NORCO/VICODIN) 5-325 MG tablet Take 1 tablet by mouth every 6 (six) hours as needed. 06/10/17   Horton, Mayer Masker, MD    Family History History reviewed. No pertinent family history.  Social History Social History   Tobacco Use  . Smoking status: Current Every Day Smoker    Types:  Cigarettes  . Smokeless tobacco: Never Used  Substance Use Topics  . Alcohol use: Yes  . Drug use: Yes    Types: Marijuana     Allergies   Patient has no known allergies.   Review of Systems Review of Systems  Unable to perform ROS: Other (Patient uncooperative)     Physical Exam Updated Vital Signs BP (!) 144/93 (BP Location: Right Arm)   Pulse (!) 115   Temp 99.8 F (37.7 C) (Oral)   Resp 18   SpO2 100%   Physical Exam  Constitutional: He is oriented to person, place, and time. He appears well-developed and well-nourished. No distress.  HENT:  Head: Normocephalic and atraumatic.  Eyes:  Bilateral injected conjunctiva  Cardiovascular: Normal rate and regular rhythm.  Pulmonary/Chest: Effort normal. No respiratory distress.  Musculoskeletal: He exhibits no edema.  Tenderness palpation left distal wrist, there is swelling that extends from the wrist into the left hand, no significant tenderness of the hand, neurovascular intact distally, 2+ radial pulse  Neurological: He is alert and oriented to person, place, and time.  Skin: Skin is warm and dry.  Psychiatric: He has a normal mood and affect.  Nursing note and vitals reviewed.    ED Treatments / Results  Labs (all labs ordered are listed, but only abnormal results are displayed) Labs Reviewed  COMPREHENSIVE METABOLIC PANEL - Abnormal; Notable for the following components:      Result Value  CO2 17 (*)    Creatinine, Ser 1.41 (*)    Anion gap 17 (*)    All other components within normal limits  ETHANOL - Abnormal; Notable for the following components:   Alcohol, Ethyl (B) 151 (*)    All other components within normal limits  CBC - Abnormal; Notable for the following components:   HCT 38.9 (*)    All other components within normal limits  RAPID URINE DRUG SCREEN, HOSP PERFORMED    EKG None  Radiology Dg Wrist Complete Left  Result Date: 06/10/2017 CLINICAL DATA:  27 year old male with fall on  outstretched hand. EXAM: LEFT WRIST - COMPLETE 3+ VIEW COMPARISON:  None. FINDINGS: There is a transverse fracture of the distal radial metaphysis. A small fracture fragment noted along the dorsum of the distal radius. Faint linear lucency through the articular surface of the distal radius may represent a vascular groove or intra-articular fracture extension. There is a negative ulnar variance. There is no dislocation. Mild soft tissue swelling of the wrist. IMPRESSION: Fracture of the distal radius with possible intra-articular extension. No dislocation. Electronically Signed   By: Elgie Collard M.D.   On: 06/10/2017 04:38   Dg Hand Complete Left  Result Date: 06/10/2017 CLINICAL DATA:  27 year old male with fall and left wrist pain. EXAM: LEFT HAND - COMPLETE 3+ VIEW COMPARISON:  Left wrist radiograph dated 06/10/2017 FINDINGS: No acute fracture or dislocation of the hand. Fracture of the distal radius as described on the earlier wrist radiograph. There is diffuse soft tissue swelling of the hand. IMPRESSION: 1. No acute fracture or dislocation of the hand. 2. Fracture of the distal radius as described on the wrist radiograph. Electronically Signed   By: Elgie Collard M.D.   On: 06/10/2017 06:16    Procedures Procedures (including critical care time)  Medications Ordered in ED Medications  HYDROcodone-acetaminophen (NORCO/VICODIN) 5-325 MG per tablet 1 tablet (1 tablet Oral Given 06/10/17 1478)     Initial Impression / Assessment and Plan / ED Course  I have reviewed the triage vital signs and the nursing notes.  Pertinent labs & imaging results that were available during my care of the patient were reviewed by me and considered in my medical decision making (see chart for details).     She presents with injury to the left wrist.  He is generally uncooperative but does not have any other obvious injury.  He does appear intoxicated.  X-rays of the left wrist show a distal radius fracture  that is intra-articular.  It is nondisplaced.  He is neuro vastly intact.  He did have swelling over the hand.  X-rays of the hand reviewed and show no evidence of abnormality.  Patient was placed in a sugar tong splint.  I did discuss the patient with Dr. Janee Morn, hand surgery.  He agrees with outpatient follow-up.  Patient discharged back into police custody.  After history, exam, and medical workup I feel the patient has been appropriately medically screened and is safe for discharge home. Pertinent diagnoses were discussed with the patient. Patient was given return precautions.   Final Clinical Impressions(s) / ED Diagnoses   Final diagnoses:  Other closed intra-articular fracture of distal end of left radius, initial encounter    ED Discharge Orders        Ordered    HYDROcodone-acetaminophen (NORCO/VICODIN) 5-325 MG tablet  Every 6 hours PRN     06/10/17 0643       Shon Baton, MD 06/10/17  0729  

## 2019-02-08 IMAGING — DX DG HAND COMPLETE 3+V*L*
3 series · 3 of 3 positions shown · non-contrast
Comparison: Left wrist radiograph dated 06/10/2017

CLINICAL DATA: 26-year-old male with fall and left wrist pain.

EXAM:
LEFT HAND - COMPLETE 3+ VIEW

[hand pa]
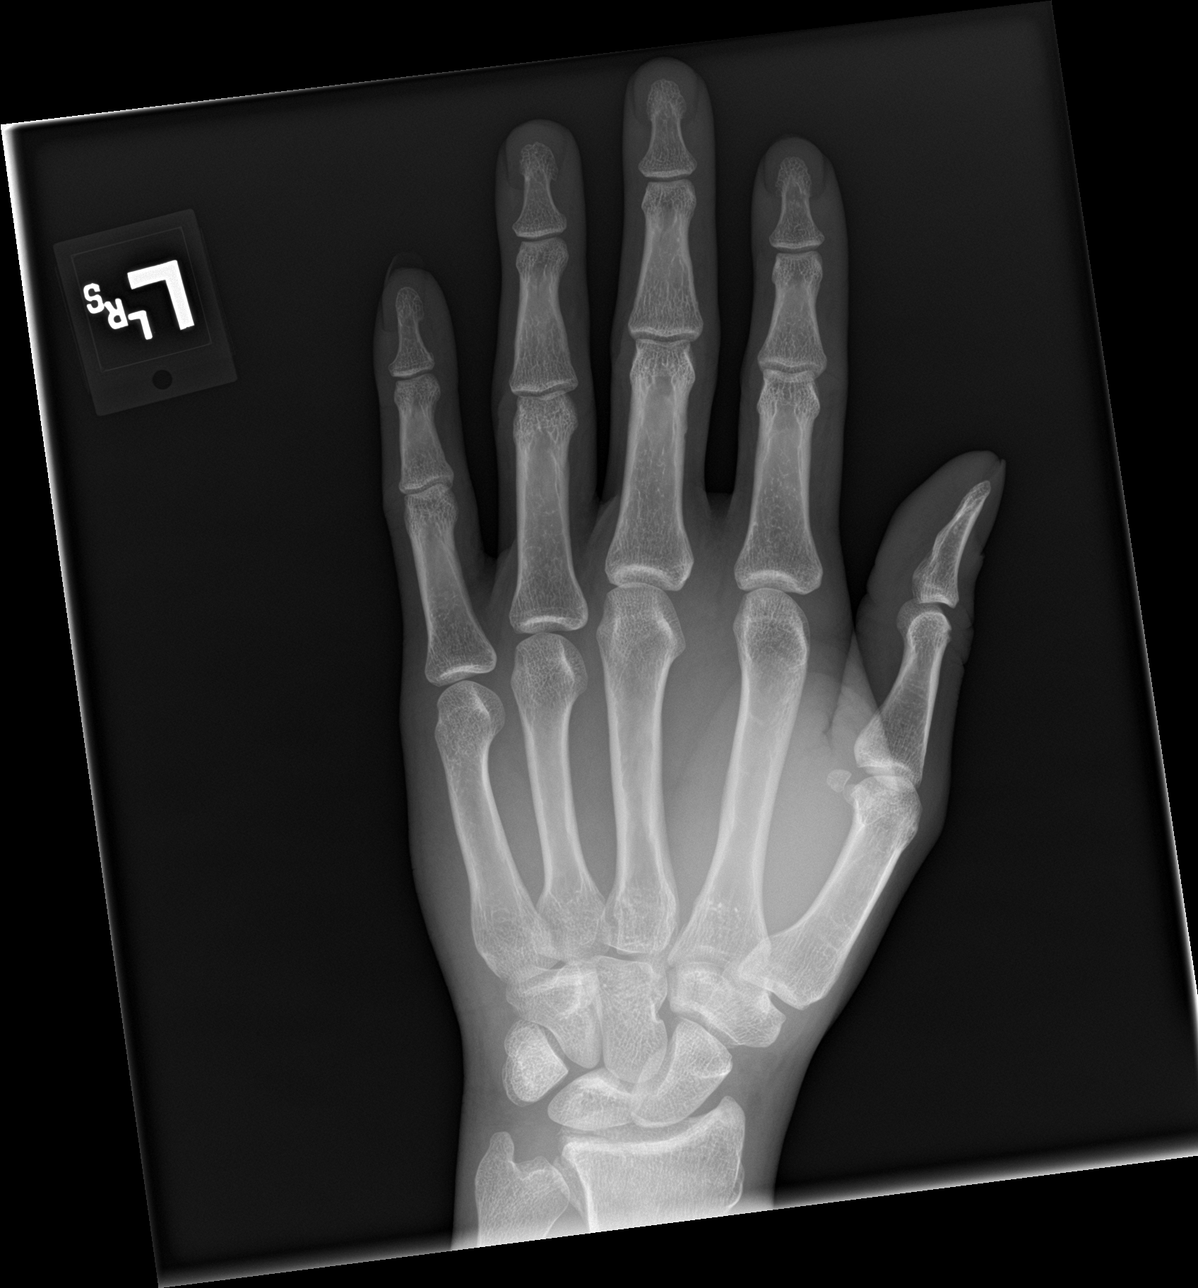

[hand obl]
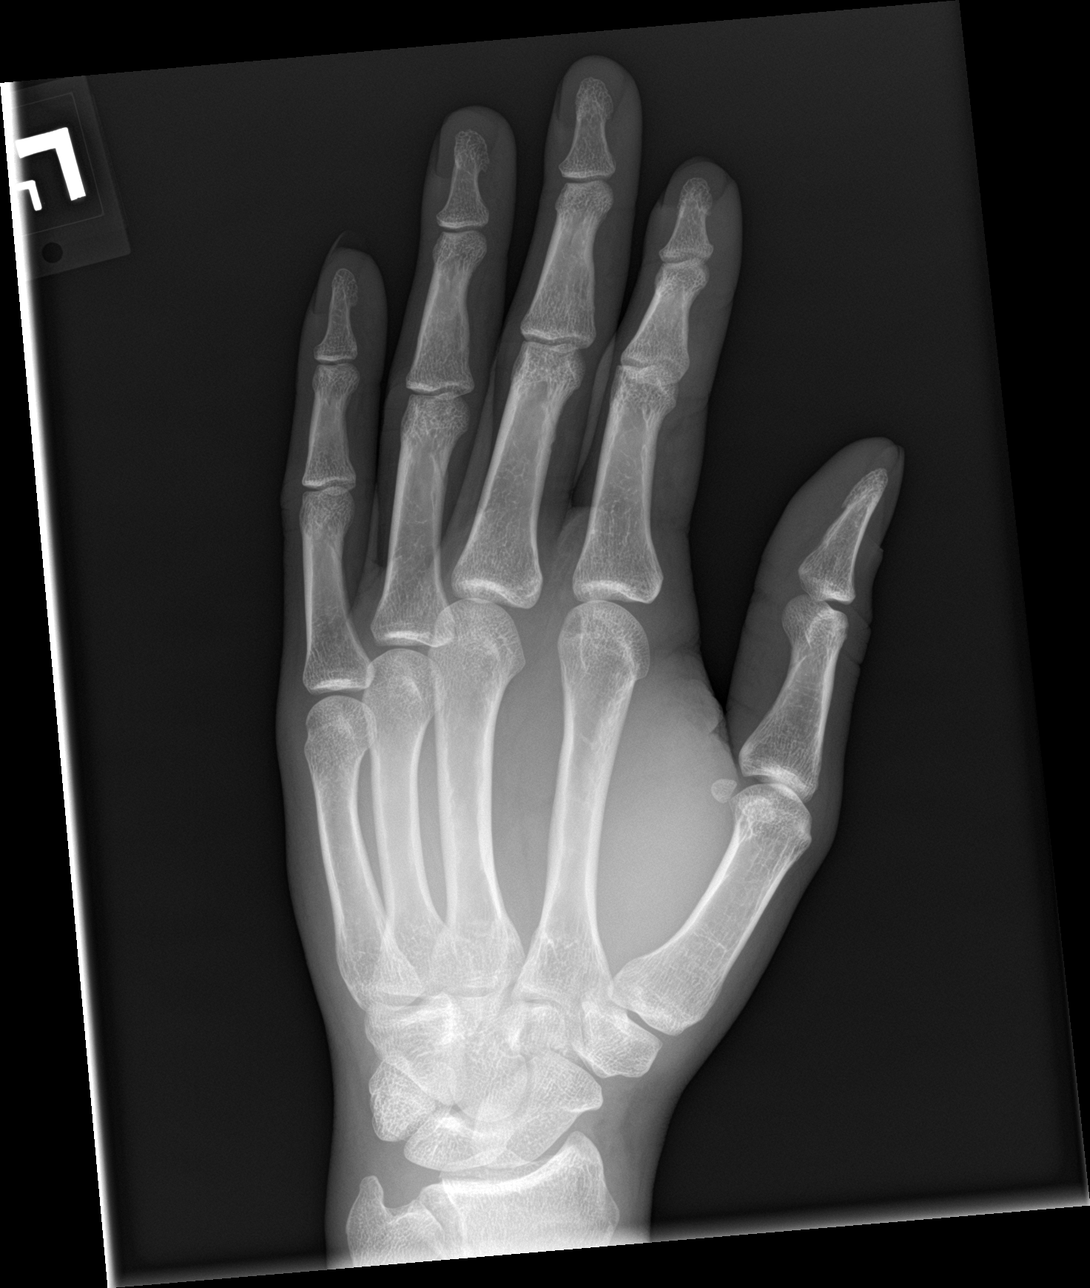

[hand lat]
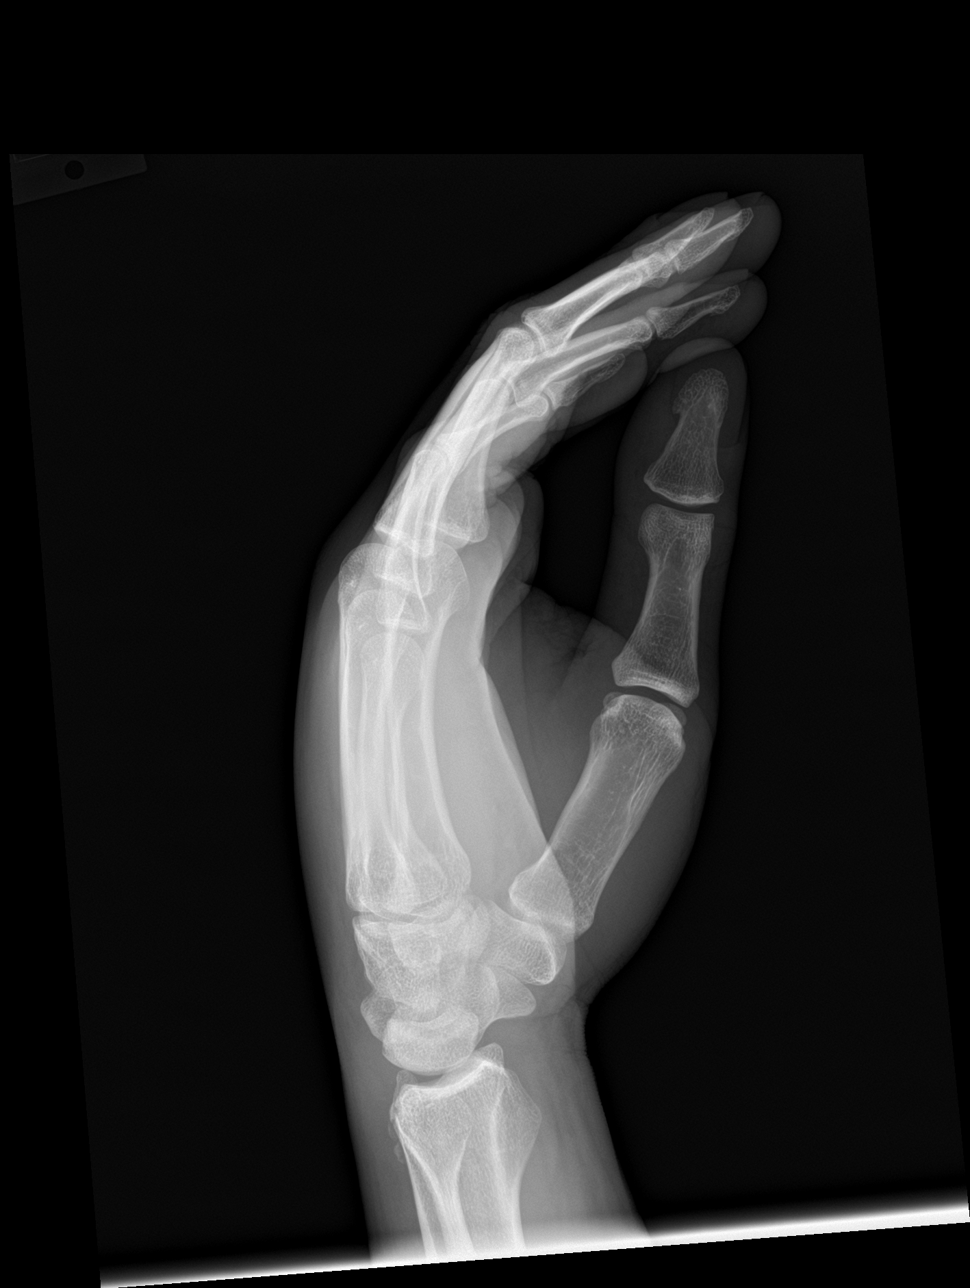

[3 of 3 positions shown; findings below may reference images not displayed]

FINDINGS: No acute fracture or dislocation of the hand. Fracture of the distal
radius as described on the earlier wrist radiograph. There is
diffuse soft tissue swelling of the hand.
IMPRESSION: 1. No acute fracture or dislocation of the hand.
2. Fracture of the distal radius as described on the wrist
radiograph.

## 2019-08-05 DEATH — deceased
